# Patient Record
Sex: Male | Born: 1995 | Race: White | Hispanic: No | Marital: Single | Smoking: Never smoker
Health system: Southern US, Community
[De-identification: ages and names within clinical notes are randomized; demographics above are authoritative.]

## PROBLEM LIST (undated history)

## (undated) DIAGNOSIS — E860 Dehydration: Secondary | ICD-10-CM

## (undated) DIAGNOSIS — R55 Syncope and collapse: Secondary | ICD-10-CM

## (undated) HISTORY — DX: Dehydration: E86.0

## (undated) HISTORY — DX: Syncope and collapse: R55

---
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
## 2016-01-17 ENCOUNTER — Encounter: Payer: Self-pay | Admitting: Emergency Medicine

## 2016-01-17 ENCOUNTER — Other Ambulatory Visit: Payer: Self-pay

## 2016-01-17 ENCOUNTER — Emergency Department
Admission: EM | Admit: 2016-01-17 | Discharge: 2016-01-17 | Disposition: A | Discharge status: Home / Self Care | Payer: BLUE CROSS/BLUE SHIELD | Attending: Emergency Medicine | Admitting: Emergency Medicine

## 2016-01-17 DIAGNOSIS — R000 Tachycardia, unspecified: Secondary | ICD-10-CM | POA: Insufficient documentation

## 2016-01-17 DIAGNOSIS — R112 Nausea with vomiting, unspecified: Secondary | ICD-10-CM | POA: Diagnosis not present

## 2016-01-17 DIAGNOSIS — E860 Dehydration: Secondary | ICD-10-CM | POA: Diagnosis not present

## 2016-01-17 DIAGNOSIS — R42 Dizziness and giddiness: Secondary | ICD-10-CM | POA: Diagnosis present

## 2016-01-17 LAB — CBC WITH DIFFERENTIAL/PLATELET
Basophils Absolute: 0.1 10*3/uL (ref 0–0.1)
Basophils Relative: 0 %
Eosinophils Absolute: 0 10*3/uL (ref 0–0.7)
Eosinophils Relative: 0 %
HEMATOCRIT: 42.2 % (ref 40.0–52.0)
HEMOGLOBIN: 14.7 g/dL (ref 13.0–18.0)
Lymphocytes Relative: 11 %
Lymphs Abs: 2.1 10*3/uL (ref 1.0–3.6)
MCH: 28.5 pg (ref 26.0–34.0)
MCHC: 34.8 g/dL (ref 32.0–36.0)
MCV: 81.7 fL (ref 80.0–100.0)
MONO ABS: 0.8 10*3/uL (ref 0.2–1.0)
MONOS PCT: 4 %
NEUTROS ABS: 16.8 10*3/uL — AB (ref 1.4–6.5)
NEUTROS PCT: 85 %
Platelets: 298 10*3/uL (ref 150–440)
RBC: 5.17 MIL/uL (ref 4.40–5.90)
RDW: 12.7 % (ref 11.5–14.5)
WBC: 19.8 10*3/uL — ABNORMAL HIGH (ref 3.8–10.6)

## 2016-01-17 LAB — BASIC METABOLIC PANEL
ANION GAP: 10 (ref 5–15)
BUN: 19 mg/dL (ref 6–20)
CO2: 25 mmol/L (ref 22–32)
CREATININE: 1.34 mg/dL — AB (ref 0.61–1.24)
Calcium: 9.3 mg/dL (ref 8.9–10.3)
Chloride: 107 mmol/L (ref 101–111)
GFR calc Af Amer: >60 mL/min (ref >60)
GFR calc non Af Amer: >60 mL/min (ref >60)
GLUCOSE: 96 mg/dL (ref 65–99)
Potassium: 4.3 mmol/L (ref 3.5–5.1)
Sodium: 142 mmol/L (ref 135–145)

## 2016-01-17 MED ORDER — SODIUM CHLORIDE 0.9 % IV BOLUS (SEPSIS)
1000.0000 mL | Freq: Once | INTRAVENOUS | Status: AC
  Administered 2016-01-17: 1000 mL via INTRAVENOUS

## 2016-01-17 NOTE — ED Provider Notes (Signed)
Time Seen: Approximately 2020 I have reviewed the triage notes  Chief Complaint: Dizziness; Tachycardia; and Emesis   History of Present Illness: Vincent Jimenez is a 20 y.o. male who states that he was at crossfit today and was working out very aggressively. He states afterward he felt very lightheaded and had nausea and vomited 1 but no blood or bile. He states he still felt very lightheadedness heart was racing. He denies any chest pain or loss of consciousness. He states he is otherwise healthy and denies any illicit drug usage. He states he feels better after he had a chance to get some IV fluids.   History reviewed. No pertinent past medical history.  There are no active problems to display for this patient.   History reviewed. No pertinent past surgical history.  History reviewed. No pertinent past surgical history.  No current outpatient prescriptions on file.  Allergies:  Review of patient's allergies indicates no known allergies.  Family History: No family history on file.  Social History: Social History  Substance Use Topics  . Smoking status: Never Smoker   . Smokeless tobacco: Never Used  . Alcohol Use: No     Review of Systems:   10 point review of systems was performed and was otherwise negative:  Constitutional: No fever Eyes: No visual disturbances ENT: No sore throat, ear pain Cardiac: No chest pain Respiratory: No shortness of breath, wheezing, or stridor Abdomen: No abdominal pain, no vomiting, No diarrhea Endocrine: No weight loss, No night sweats Extremities: No peripheral edema, cyanosis Skin: No rashes, easy bruising Neurologic: No focal weakness, trouble with speech or swollowing Urologic: No dysuria, Hematuria, or urinary frequency *  Physical Exam:  ED Triage Vitals  Enc Vitals Group     BP 01/17/16 1950 143/85 mmHg     Pulse Rate 01/17/16 1950 119     Resp 01/17/16 1950 18     Temp 01/17/16 1950 98 F (36.7 C)     Temp  Source 01/17/16 1950 Oral     SpO2 01/17/16 1950 98 %     Weight --      Height --      Head Cir --      Peak Flow --      Pain Score --      Pain Loc --      Pain Edu? --      Excl. in GC? --     General: Awake , Alert , and Oriented times 3; GCS 15 Head: Normal cephalic , atraumatic Eyes: Pupils equal , round, reactive to light Nose/Throat: No nasal drainage, patent upper airway without erythema or exudate.  Neck: Supple, Full range of motion, No anterior adenopathy or palpable thyroid masses Lungs: Clear to ascultation without wheezes , rhonchi, or rales Heart: Tachycardic, regular rhythm without murmurs , gallops , or rubs Abdomen: Soft, non tender without rebound, guarding , or rigidity; bowel sounds positive and symmetric in all 4 quadrants. No organomegaly .        Extremities: 2 plus symmetric pulses. No edema, clubbing or cyanosis Neurologic: normal ambulation, Motor symmetric without deficits, sensory intact Skin: warm, dry, no rashes   Labs:   All laboratory work was reviewed including any pertinent negatives or positives listed below:  Labs Reviewed  BASIC METABOLIC PANEL  CBC WITH DIFFERENTIAL/PLATELET   review laboratory work shows an elevated white blood cell count which may be hemoconcentration but otherwise no significant abnormalities. Patent and also slightly elevated again  likely due to dehydration  EKG: ED ECG REPORT I, Jennye Moccasin, the attending physician, personally viewed and interpreted this ECG.  Date: 01/17/2016 EKG Time: 1954 Rate: 118 Rhythm: normal sinus rhythm QRS Axis: normal Intervals: normal ST/T Wave abnormalities: normal Conduction Disturbances: none Narrative Interpretation: unremarkable No acute abnormalities   ED Course:  Patient's stay here showed symptomatic improvement. He received 2 L of normal saline and had no other further episodes of nausea. Since he is able to maintain oral food and fluid intake I felt he could be  discharged at this point. He is been advised that he still needs to continue drinking plenty of fluids at home and return here if he has any other new issues.    Assessment: * Dehydration     Plan:  Outpatient management Patient was advised to return immediately if condition worsens. Patient was advised to follow up with their primary care physician or other specialized physicians involved in their outpatient care           Jennye Moccasin, MD 01/17/16 2256

## 2016-01-17 NOTE — ED Notes (Signed)
MD at the bedside to discuss discharge plan

## 2016-01-17 NOTE — Discharge Instructions (Signed)
Dehydration in Sports °Dehydration is a condition in which you do not have enough fluid or water in your body. Your body needs a certain amount of water and other fluid to maintain its blood volume. During exercise, your body may not be able to maintain the fluid levels that are needed to function properly. Dehydration happens when you take in less fluid than you lose. Athletes lose fluid during exercise when they sweat and breathe. Additional fluid is lost during urination, vomiting, and diarrhea. To prevent dehydration, it is important for athletes to take in enough water and fluid to replace the fluid that they lose during exercise. °CAUSES °Common causes of dehydration among athletes include: °· Diarrhea. °· Vomiting. °· Not drinking enough fluid during strenuous exercise or during an illness. °· Not eating enough food during strenuous exercise or during an illness. °· Not consuming enough fluid or food after strenuous exercise. °· Exercising in hot or humid weather. °RISK FACTORS °This condition is more likely to develop in: °· Athletes who are taking certain medicines that cause the body to lose excess fluid (diuretics). °· Athletes who have a chronic illness, such as diabetes. °· Young children. °· Older adults. °· Athletes who live at high altitudes. °· Endurance athletes. °SYMPTOMS  °Mild Dehydration °· Thirst. °· Dry lips. °· Slightly dry mouth. °· Dry, warm skin. °Moderate Dehydration °· Very dry mouth. °· Muscle cramps. °· Dark urine and decreased urine production. °· Decreased tear production. °· Headache. °· Light-headedness, especially when you stand up from a sitting position. °Severe Dehydration °· Changes in skin. °¨ Blue lips. °¨ Skin does not spring back quickly when lightly pinched and released. °· Changes in body fluids. °¨ Extreme thirst. °¨ No tears. °¨ Not able to sweat when body temperature is high, such as in hot weather. °¨ Minimal urine production. °· Changes in vital signs. °¨ Rapid,  weak pulse (more than 100 beats per minute when you are sitting still). °¨ Rapid breathing. °¨ Low blood pressure. °¨ Unconsciousness. °· Other changes. °¨ Sunken eyes. °¨ Cold hands and feet. °¨ Confusion and lethargy. °¨ Difficulty being awakened. °¨ Fainting (syncope). °¨ Short-term weight loss. °DIAGNOSIS °This condition may be diagnosed based on your symptoms. You may also have tests to determine how severe your dehydration is. These tests may include: °· Urine tests. °· Blood tests. °TREATMENT °Dehydration should be treated right away. Do not wait until dehydration becomes severe. Treatment depends on the severity of the dehydration. °Treatment for Mild Dehydration °· Drinking plenty of water to replace the fluid you have lost. °· Replacing minerals in your blood (electrolytes) that you may have lost. °Treatment for Moderate Dehydration °· Consuming oral rehydration solution (ORS). °Treatment for Severe Dehydration °· Receiving fluid through an IV tube. °· Receiving electrolyte solution through a feeding tube that is passed through your nose and into your stomach (nasogastric tube or NG tube). °HOME CARE INSTRUCTIONS °· Drink enough fluid to keep your urine clear or pale yellow. °· Drink water or fluid slowly by taking small sips. °· Have food or beverages that contain electrolytes. Examples include salt, bananas, and sports drinks. °· Take over-the-counter and prescription medicines only as told by your health care provider. °· Prepare ORS according to the manufacturer's instructions. Take sips of ORS every 5 minutes until your urine returns to normal. °· If you have vomiting or diarrhea, continue to try to drink water, ORS, or both. °· If you have diarrhea, avoid: °¨ Beverages that contain caffeine. °¨   Fruit juice.  Milk.  Carbonated soft drinks.  Do not take salt tablets. This can lead to the condition of having too much sodium in your body (hypernatremia). PREVENTION  Drink water before, during,  and after physical activity, even if you do not feel thirsty. Drink small amounts of water frequently throughout sporting events. Drink more water if you are exercising in hot or humid weather or in high altitudes.  If you are exercising for more than an hour, consider drinking a sports drink.  If you are experiencing vomiting or diarrhea, avoid exercise.  Before and after exercise, eat plenty of foods that have a high water content. These include fruits and vegetables.  Avoid alcohol before, during, and after strenuous exercise. SEEK MEDICAL CARE IF:  You cannot eat or drink without vomiting.  You have severe diarrhea with vomiting or a fever.  You have severe diarrhea without vomiting or a fever.  You have had moderate diarrhea during a period of more than 24 hours. SEEK IMMEDIATE MEDICAL CARE IF:  You have extreme thirst.  You have not urinated in 6-8 hours, or you have urinated only a small amount of very dark urine.  You have shriveled skin.  You are dizzy, confused, or both.   This information is not intended to replace advice given to you by your health care provider. Make sure you discuss any questions you have with your health care provider.   Document Released: 11/25/2005 Document Revised: 08/16/2015 Document Reviewed: 12/09/2014 Elsevier Interactive Patient Education [employer]  Please return immediately if condition worsens. Please contact her primary physician or the physician you were given for referral. If you have any specialist physicians involved in her treatment and plan please also contact them. Thank you for using [place] [facility].

## 2016-01-17 NOTE — ED Notes (Signed)
Pt up to restroom with steady gait. Denies dizziness at this time.

## 2021-03-16 ENCOUNTER — Emergency Department: Payer: BLUE CROSS/BLUE SHIELD

## 2021-03-16 ENCOUNTER — Other Ambulatory Visit: Payer: Self-pay

## 2021-03-16 ENCOUNTER — Emergency Department
Admission: EM | Admit: 2021-03-16 | Discharge: 2021-03-17 | Disposition: A | Discharge status: Home / Self Care | Payer: BLUE CROSS/BLUE SHIELD | Attending: Emergency Medicine | Admitting: Emergency Medicine

## 2021-03-16 DIAGNOSIS — R079 Chest pain, unspecified: Secondary | ICD-10-CM

## 2021-03-16 DIAGNOSIS — R0789 Other chest pain: Secondary | ICD-10-CM | POA: Diagnosis not present

## 2021-03-16 LAB — BASIC METABOLIC PANEL
Anion gap: 8 (ref 5–15)
BUN: 10 mg/dL (ref 6–20)
CO2: 24 mmol/L (ref 22–32)
Calcium: 9 mg/dL (ref 8.9–10.3)
Chloride: 105 mmol/L (ref 98–111)
Creatinine, Ser: 1.05 mg/dL (ref 0.61–1.24)
GFR, Estimated: >60 mL/min (ref >60)
Glucose, Bld: 93 mg/dL (ref 70–99)
Potassium: 3.8 mmol/L (ref 3.5–5.1)
Sodium: 137 mmol/L (ref 135–145)

## 2021-03-16 LAB — CBC
HCT: 44.5 % (ref 39.0–52.0)
Hemoglobin: 15.9 g/dL (ref 13.0–17.0)
MCH: 28.8 pg (ref 26.0–34.0)
MCHC: 35.7 g/dL (ref 30.0–36.0)
MCV: 80.5 fL (ref 80.0–100.0)
Platelets: 346 10*3/uL (ref 150–400)
RBC: 5.53 MIL/uL (ref 4.22–5.81)
RDW: 12.2 % (ref 11.5–15.5)
WBC: 13.5 10*3/uL — ABNORMAL HIGH (ref 4.0–10.5)
nRBC: 0 % (ref 0.0–0.2)

## 2021-03-16 LAB — TROPONIN I (HIGH SENSITIVITY): Troponin I (High Sensitivity): 3 ng/L (ref <18)

## 2021-03-16 NOTE — ED Provider Notes (Signed)
[facility] Emergency Department Provider Note   ____________________________________________   Event Date/Time   First MD Initiated Contact with Patient 03/16/21 2357     (approximate)  I have reviewed the triage vital signs and the nursing notes.   HISTORY  Chief Complaint Chest Pain    HPI Vincent Jimenez is a 25 y.o. male who presents to the ED from home with a chief complaint of chest pain.  Patient reports left and right-sided chest pain x3 to 4 weeks.  Describes pain as sore and throbbing, and exacerbated by movement.  Patient did recently take out boxing.  Denies direct strikes to his chest.  Denies associated diaphoresis, shortness of breath, nausea/vomiting or dizziness.  Denies fever, cough, abdominal pain.  Denies COVID-19 exposure.  Denies recent travel or hormone use.     Past medical history None  There are no problems to display for this patient.   History reviewed. No pertinent surgical history.  Prior to Admission medications   Not on File    Allergies Patient has no known allergies.  History reviewed. No pertinent family history.  Social History Social History   Tobacco Use  . Smoking status: Never Smoker  . Smokeless tobacco: Never Used  Substance Use Topics  . Alcohol use: Yes    Comment: occasional   . Drug use: No    Review of Systems  Constitutional: No fever/chills Eyes: No visual changes. ENT: No sore throat. Cardiovascular: Positive for chest pain. Respiratory: Denies shortness of breath. Gastrointestinal: No abdominal pain.  No nausea, no vomiting.  No diarrhea.  No constipation. Genitourinary: Negative for dysuria. Musculoskeletal: Negative for back pain. Skin: Negative for rash. Neurological: Negative for headaches, focal weakness or numbness.   ____________________________________________   PHYSICAL EXAM:  VITAL SIGNS: ED Triage Vitals  Enc Vitals Group     BP 03/16/21 2108 (!) 153/100      Pulse Rate 03/16/21 2108 (!) 110     Resp 03/16/21 2108 15     Temp 03/16/21 2108 97.8 F (36.6 C)     Temp Source 03/16/21 2108 Oral     SpO2 03/16/21 2108 97 %     Weight 03/16/21 2103 208 lb (94.3 kg)     Height 03/16/21 2103 5\' 3"  (1.6 m)     Head Circumference --      Peak Flow --      Pain Score 03/16/21 2102 7     Pain Loc --      Pain Edu? --      Excl. in GC? --     Constitutional: Alert and oriented. Well appearing and in no acute distress. Eyes: Conjunctivae are normal. PERRL. EOMI. Head: Atraumatic. Nose: No congestion/rhinnorhea. Mouth/Throat: Mucous membranes are moist.   Neck: No stridor.   Cardiovascular: Normal rate, regular rhythm. Grossly normal heart sounds.  Good peripheral circulation. Respiratory: Normal respiratory effort.  No retractions. Lungs CTAB. Left anterior chest wall tender to palpation and with movement of trunk. Gastrointestinal: Soft and nontender to light or deep palpation. No distention. No abdominal bruits. No CVA tenderness. Musculoskeletal: No lower extremity tenderness nor edema.  No joint effusions. Neurologic:  Normal speech and language. No gross focal neurologic deficits are appreciated. No gait instability. Skin:  Skin is warm, dry and intact. No rash noted. Psychiatric: Mood and affect are normal. Speech and behavior are normal.  ____________________________________________   LABS (all labs ordered are listed, but only abnormal results are displayed)  Labs Reviewed  CBC - Abnormal; Notable for the following components:      Result Value   WBC 13.5 (*)    All other components within normal limits  BASIC METABOLIC PANEL  CK  TROPONIN I (HIGH SENSITIVITY)   ____________________________________________  EKG  ED ECG REPORT I, Jachai Okazaki J, the attending physician, personally viewed and interpreted this ECG.   Date: 03/17/2021  EKG Time: 2105  Rate: 119  Rhythm: sinus tachycardia  Axis: Normal  Intervals:none  ST&T  Change: Nonspecific  ____________________________________________  RADIOLOGY I, Kentravious Lipford J, personally viewed and evaluated these images (plain radiographs) as part of my medical decision making, as well as reviewing the written report by the radiologist.  ED MD interpretation: No acute cardiopulmonary process  Official radiology report(s): DG Chest 2 View  Result Date: 03/16/2021 CLINICAL DATA:  Chest pain for several weeks. EXAM: CHEST - 2 VIEW COMPARISON:  10/07/2011 FINDINGS: The cardiac silhouette, mediastinal and hilar contours are normal. The lungs are clear. The bony thorax is intact. IMPRESSION: Normal chest x-ray. Electronically Signed   By: Rudie Meyer M.D.   On: 03/16/2021 21:27    ____________________________________________   PROCEDURES  Procedure(s) performed (including Critical Care):  Procedures   ____________________________________________   INITIAL IMPRESSION / ASSESSMENT AND PLAN / ED COURSE  As part of my medical decision making, I reviewed the following data within the electronic MEDICAL RECORD NUMBER Nursing notes reviewed and incorporated, Labs reviewed, EKG interpreted, Old chart reviewed, Radiograph reviewed and Notes from prior ED visits     25 year old otherwise healthy male who presents with a several week history of chest pain Differential diagnosis includes, but is not limited to, ACS, aortic dissection, pulmonary embolism, cardiac tamponade, pneumothorax, pneumonia, pericarditis, myocarditis, GI-related causes including esophagitis/gastritis, and musculoskeletal chest wall pain.    Laboratory results including troponin unremarkable.  Do not feel repeat troponin warranted as chest pain has been ongoing for several weeks.  Given patient's recent boxing, will check CK.  Tachycardia noted, patient notes he has baseline resting tachycardia.  No history of thyroid issues.  Will administer IV fluids, IV Toradol for musculoskeletal pain and  reassess.  Clinical Course as of 03/17/21 0257  Sat Mar 17, 2021  0119 Updated patient on CK result.  Heart rate improved.  He is feeling significantly better.  Strict return precautions given.  Patient verbalizes understanding agrees with plan of care. [JS]    Clinical Course User Index [JS] Irean Hong, MD     ____________________________________________   FINAL CLINICAL IMPRESSION(S) / ED DIAGNOSES  Final diagnoses:  Nonspecific chest pain  Chest wall pain     ED Discharge Orders    None      *Please note:  Vincent Jimenez was evaluated in Emergency Department on 03/17/2021 for the symptoms described in the history of present illness. He was evaluated in the context of the global COVID-19 pandemic, which necessitated consideration that the patient might be at risk for infection with the SARS-CoV-2 virus that causes COVID-19. Institutional protocols and algorithms that pertain to the evaluation of patients at risk for COVID-19 are in a state of rapid change based on information released by regulatory bodies including the CDC and federal and state organizations. These policies and algorithms were followed during the patient's care in the ED.  Some ED evaluations and interventions may be delayed as a result of limited staffing during and the pandemic.*   Note:  This document was prepared using Dragon voice recognition software and may include  unintentional dictation errors.   Irean Hong, MD 03/17/21 [phone]

## 2021-03-16 NOTE — ED Provider Notes (Incomplete)
[facility] Emergency Department Provider Note   ____________________________________________   Event Date/Time   First MD Initiated Contact with Patient 03/16/21 2357     (approximate)  I have reviewed the triage vital signs and the nursing notes.   HISTORY  Chief Complaint Chest Pain    HPI Vincent Jimenez is a 25 y.o. male ***        {**SYMPTOM/COMPLAINT  LOCATION (describe anatomically) DURATION (when did it start) TIMING (onset and pattern) SEVERITY (0-10, mild/moderate/severe) QUALITY (description of symptoms) CONTEXT (recent surgery, new meds, activity, etc.) MODIFYINGFACTORS (what makes it better/worse) ASSOCIATEDSYMPTOMS (pertinent positives and negatives)**} History reviewed. No pertinent past medical history.  There are no problems to display for this patient.   History reviewed. No pertinent surgical history.  Prior to Admission medications   Not on File    Allergies Patient has no known allergies.  History reviewed. No pertinent family history.  Social History Social History   Tobacco Use  . Smoking status: Never Smoker  . Smokeless tobacco: Never Used  Substance Use Topics  . Alcohol use: Yes    Comment: occasional   . Drug use: No    Review of Systems {** Revise as appropriate then delete this line - Documentation of 10 systems is required  **} Constitutional: No fever/chills Eyes: No visual changes. ENT: No sore throat. Cardiovascular: Denies chest pain. Respiratory: Denies shortness of breath. Gastrointestinal: No abdominal pain.  No nausea, no vomiting.  No diarrhea.  No constipation. Genitourinary: Negative for dysuria. Musculoskeletal: Negative for back pain. Skin: Negative for rash. Neurological: Negative for headaches, focal weakness or numbness. {**Psychiatric:  Endocrine:  Hematological/Lymphatic:  Allergic/Immunilogical: **}  ____________________________________________   PHYSICAL  EXAM:  VITAL SIGNS: ED Triage Vitals  Enc Vitals Group     BP 03/16/21 2108 (!) 153/100     Pulse Rate 03/16/21 2108 (!) 110     Resp 03/16/21 2108 15     Temp 03/16/21 2108 97.8 F (36.6 C)     Temp Source 03/16/21 2108 Oral     SpO2 03/16/21 2108 97 %     Weight 03/16/21 2103 208 lb (94.3 kg)     Height 03/16/21 2103 5\' 3"  (1.6 m)     Head Circumference --      Peak Flow --      Pain Score 03/16/21 2102 7     Pain Loc --      Pain Edu? --      Excl. in GC? --    {** Revise as appropriate then delete this line - 8 systems required **} Constitutional: Alert and oriented. Well appearing and in no acute distress. Eyes: Conjunctivae are normal. PERRL. EOMI. Head: Atraumatic. Nose: No congestion/rhinnorhea. Mouth/Throat: Mucous membranes are moist.  Oropharynx non-erythematous. Neck: No stridor.  {**No cervical spine tenderness to palpation.**} {**Hematological/Lymphatic/Immunilogical: No cervical lymphadenopathy. **}Cardiovascular: Normal rate, regular rhythm. Grossly normal heart sounds.  Good peripheral circulation. Respiratory: Normal respiratory effort.  No retractions. Lungs CTAB. Gastrointestinal: Soft and nontender. No distention. No abdominal bruits. No CVA tenderness. {**Genitourinary:  **}Musculoskeletal: No lower extremity tenderness nor edema.  No joint effusions. Neurologic:  Normal speech and language. No gross focal neurologic deficits are appreciated. No gait instability. Skin:  Skin is warm, dry and intact. No rash noted. Psychiatric: Mood and affect are normal. Speech and behavior are normal.  ____________________________________________   LABS (all labs ordered are listed, but only abnormal results are displayed)  Labs Reviewed  CBC - Abnormal;  Notable for the following components:      Result Value   WBC 13.5 (*)    All other components within normal limits  BASIC METABOLIC PANEL  TROPONIN I (HIGH SENSITIVITY)  TROPONIN I (HIGH SENSITIVITY)    ____________________________________________  EKG  *** ____________________________________________  RADIOLOGY I, SUNG,JADE J, personally viewed and evaluated these images (plain radiographs) as part of my medical decision making, as well as reviewing the written report by the radiologist.  ED MD interpretation:  ***  Official radiology report(s): DG Chest 2 View  Result Date: 03/16/2021 CLINICAL DATA:  Chest pain for several weeks. EXAM: CHEST - 2 VIEW COMPARISON:  10/07/2011 FINDINGS: The cardiac silhouette, mediastinal and hilar contours are normal. The lungs are clear. The bony thorax is intact. IMPRESSION: Normal chest x-ray. Electronically Signed   By: Rudie Meyer M.D.   On: 03/16/2021 21:27    ____________________________________________   PROCEDURES  Procedure(s) performed (including Critical Care):  Procedures   ____________________________________________   INITIAL IMPRESSION / ASSESSMENT AND PLAN / ED COURSE  As part of my medical decision making, I reviewed the following data within the electronic MEDICAL RECORD NUMBER {[id number]::"Notes from prior ED visits","[place] Controlled Substance Database"}        ***      ____________________________________________   FINAL CLINICAL IMPRESSION(S) / ED DIAGNOSES  Final diagnoses:  None     ED Discharge Orders    None      *Please note:  Vincent Jimenez was evaluated in Emergency Department on 03/16/2021 for the symptoms described in the history of present illness. He was evaluated in the context of the global COVID-19 pandemic, which necessitated consideration that the patient might be at risk for infection with the SARS-CoV-2 virus that causes COVID-19. Institutional protocols and algorithms that pertain to the evaluation of patients at risk for COVID-19 are in a state of rapid change based on information released by regulatory bodies including the CDC and federal and state organizations. These policies and  algorithms were followed during the patient's care in the ED.  Some ED evaluations and interventions may be delayed as a result of limited staffing during and the pandemic.*   Note:  This document was prepared using Dragon voice recognition software and may include unintentional dictation errors.

## 2021-03-16 NOTE — ED Triage Notes (Signed)
Pt presents to ER c/o chest pain that has been ongoing for several weeks.  Pt describes pain as throbbing and it is located in the left rib cage area and some on the right side of his chest.   Pt denies hx of cardiac problems. Pt states he did recently take up boxing, and thinks he might have a muscle strain.  Pt A&O x4 at this time in NAD.

## 2021-03-17 LAB — CK: Total CK: 200 U/L (ref 49–397)

## 2021-03-17 MED ORDER — SODIUM CHLORIDE 0.9 % IV BOLUS
1000.0000 mL | Freq: Once | INTRAVENOUS | Status: AC
  Administered 2021-03-17: 1000 mL via INTRAVENOUS

## 2021-03-17 MED ORDER — KETOROLAC TROMETHAMINE 30 MG/ML IJ SOLN
15.0000 mg | Freq: Once | INTRAMUSCULAR | Status: AC
  Administered 2021-03-17: 15 mg via INTRAVENOUS
  Filled 2021-03-17: qty 1

## 2021-03-17 NOTE — ED Notes (Signed)
Patient denies pain and is resting comfortably.  

## 2021-03-17 NOTE — Discharge Instructions (Addendum)
You may take Tylenol and/or Ibuprofen as needed for pain.  Apply moist heat to affected area several times daily.  Return to the ER for worsening symptoms, persistent vomiting, difficulty breathing or other concerns. 

## 2021-04-03 ENCOUNTER — Other Ambulatory Visit: Payer: Self-pay

## 2021-04-03 ENCOUNTER — Emergency Department: Payer: BLUE CROSS/BLUE SHIELD

## 2021-04-03 ENCOUNTER — Emergency Department
Admission: EM | Admit: 2021-04-03 | Discharge: 2021-04-03 | Disposition: A | Discharge status: Home / Self Care | Payer: BLUE CROSS/BLUE SHIELD | Attending: Emergency Medicine | Admitting: Emergency Medicine

## 2021-04-03 DIAGNOSIS — R002 Palpitations: Secondary | ICD-10-CM | POA: Insufficient documentation

## 2021-04-03 LAB — CBC
HCT: 44.8 % (ref 39.0–52.0)
Hemoglobin: 16 g/dL (ref 13.0–17.0)
MCH: 28.9 pg (ref 26.0–34.0)
MCHC: 35.7 g/dL (ref 30.0–36.0)
MCV: 80.9 fL (ref 80.0–100.0)
Platelets: 358 10*3/uL (ref 150–400)
RBC: 5.54 MIL/uL (ref 4.22–5.81)
RDW: 12.5 % (ref 11.5–15.5)
WBC: 13.2 10*3/uL — ABNORMAL HIGH (ref 4.0–10.5)
nRBC: 0 % (ref 0.0–0.2)

## 2021-04-03 LAB — BASIC METABOLIC PANEL
Anion gap: 8 (ref 5–15)
BUN: 13 mg/dL (ref 6–20)
CO2: 23 mmol/L (ref 22–32)
Calcium: 8.8 mg/dL — ABNORMAL LOW (ref 8.9–10.3)
Chloride: 101 mmol/L (ref 98–111)
Creatinine, Ser: 1.04 mg/dL (ref 0.61–1.24)
GFR, Estimated: >60 mL/min (ref >60)
Glucose, Bld: 108 mg/dL — ABNORMAL HIGH (ref 70–99)
Potassium: 3.6 mmol/L (ref 3.5–5.1)
Sodium: 132 mmol/L — ABNORMAL LOW (ref 135–145)

## 2021-04-03 LAB — TSH: TSH: 3.644 u[IU]/mL (ref 0.350–4.500)

## 2021-04-03 LAB — TROPONIN I (HIGH SENSITIVITY): Troponin I (High Sensitivity): <2 ng/L (ref <18)

## 2021-04-03 LAB — MAGNESIUM: Magnesium: 2.2 mg/dL (ref 1.7–2.4)

## 2021-04-03 MED ORDER — PROPRANOLOL HCL 20 MG PO TABS: 20.0000 mg | ORAL_TABLET | Freq: Two times a day (BID) | ORAL | 0 refills | Status: DC

## 2021-04-03 MED ORDER — LACTATED RINGERS IV BOLUS
1000.0000 mL | Freq: Once | INTRAVENOUS | Status: AC
  Administered 2021-04-03: 1000 mL via INTRAVENOUS

## 2021-04-03 MED ORDER — PROPRANOLOL HCL 20 MG PO TABS
20.0000 mg | ORAL_TABLET | Freq: Once | ORAL | Status: AC
  Administered 2021-04-03: 20 mg via ORAL
  Filled 2021-04-03: qty 1

## 2021-04-03 NOTE — ED Notes (Signed)
Patient transported to X-ray 

## 2021-04-03 NOTE — ED Provider Notes (Signed)
[facility] Emergency Department Provider Note  ____________________________________________   Event Date/Time   First MD Initiated Contact with Patient 04/03/21 1507     (approximate)  I have reviewed the triage vital signs and the nursing notes.   HISTORY  Chief Complaint Palpitations    HPI Vincent Jimenez is a 25 y.o. male  Here with palpitations. Pt reports that he was at work today when around 1:30 PM, he developed acute onset of palpitations and chest pain. Began acutely, radiated toward his R arm. Prescribed propranolol by his PCP yesterday - says it made him feel better. No CP, SOB now after taking it. He had some mild SOB before taking the medication. These episodes have been coming and going for months. Tried omeprazole w/o significant relief of CP, palpitations. No h/o DVT/PE. No recent injuries. Unknown if he has checked a TSH. No known thyroid issue.       History reviewed. No pertinent past medical history.  There are no problems to display for this patient.   History reviewed. No pertinent surgical history.  Prior to Admission medications   Medication Sig Start Date End Date Taking? Authorizing Provider  propranolol (INDERAL) 20 MG tablet Take 1 tablet (20 mg total) by mouth 2 (two) times daily. 04/03/21 05/03/21 Yes Shaune Pollack, MD  omeprazole (PRILOSEC) 20 MG capsule PLEASE SEE ATTACHED FOR DETAILED DIRECTIONS 03/12/21   [provider]    Allergies Patient has no known allergies.  No family history on file.  Social History Social History   Tobacco Use  . Smoking status: Never Smoker  . Smokeless tobacco: Never Used  Substance Use Topics  . Alcohol use: Yes    Comment: occasional   . Drug use: No    Review of Systems  Review of Systems  Constitutional: Positive for fatigue. Negative for chills and fever.  HENT: Negative for sore throat.   Respiratory: Positive for chest tightness. Negative for shortness  of breath.   Cardiovascular: Positive for chest pain and palpitations.  Gastrointestinal: Negative for abdominal pain.  Genitourinary: Negative for flank pain.  Musculoskeletal: Negative for neck pain.  Skin: Negative for rash and wound.  Allergic/Immunologic: Negative for immunocompromised state.  Neurological: Negative for weakness and numbness.  Hematological: Does not bruise/bleed easily.  All other systems reviewed and are negative.    ____________________________________________  PHYSICAL EXAM:      VITAL SIGNS: ED Triage Vitals  Enc Vitals Group     BP 04/03/21 1510 (!) 143/74     Pulse Rate 04/03/21 1510 (!) 111     Resp 04/03/21 1510 17     Temp 04/03/21 1510 98.4 F (36.9 C)     Temp Source 04/03/21 1510 Oral     SpO2 04/03/21 1510 94 %     Weight 04/03/21 1511 207 lb 3.7 oz (94 kg)     Height 04/03/21 1511 5\' 3"  (1.6 m)     Head Circumference --      Peak Flow --      Pain Score 04/03/21 1511 0     Pain Loc --      Pain Edu? --      Excl. in GC? --      Physical Exam Vitals and nursing note reviewed.  Constitutional:      General: He is not in acute distress.    Appearance: He is well-developed.  HENT:     Head: Normocephalic and atraumatic.  Eyes:  Conjunctiva/sclera: Conjunctivae normal.  Cardiovascular:     Rate and Rhythm: Normal rate and regular rhythm.     Heart sounds: Normal heart sounds. No murmur heard. No friction rub.  Pulmonary:     Effort: Pulmonary effort is normal. No respiratory distress.     Breath sounds: Normal breath sounds. No wheezing or rales.  Abdominal:     General: There is no distension.     Palpations: Abdomen is soft.     Tenderness: There is no abdominal tenderness.  Musculoskeletal:     Cervical back: Neck supple.  Skin:    General: Skin is warm.     Capillary Refill: Capillary refill takes less than 2 seconds.  Neurological:     Mental Status: He is alert and oriented to person, place, and time.     Motor:  No abnormal muscle tone.       ____________________________________________   LABS (all labs ordered are listed, but only abnormal results are displayed)  Labs Reviewed  BASIC METABOLIC PANEL - Abnormal; Notable for the following components:      Result Value   Sodium 132 (*)    Glucose, Bld 108 (*)    Calcium 8.8 (*)    All other components within normal limits  CBC - Abnormal; Notable for the following components:   WBC 13.2 (*)    All other components within normal limits  MAGNESIUM  TSH  TROPONIN I (HIGH SENSITIVITY)  TROPONIN I (HIGH SENSITIVITY)    ____________________________________________  EKG: Sinus tachycardia, VR 118. PR 113, QRS 75, QTc 513. No acute St elevations or depressions. No ischemia or infarct. ________________________________________  RADIOLOGY All imaging, including plain films, CT scans, and ultrasounds, independently reviewed by me, and interpretations confirmed via formal radiology reads.  ED MD interpretation:   CXR: Clear  Official radiology report(s): DG Chest 2 View  Result Date: 04/03/2021 CLINICAL DATA:  25 year old male with palpitation. EXAM: CHEST - 2 VIEW COMPARISON:  Chest radiograph dated 03/16/2021. FINDINGS: The heart size and mediastinal contours are within normal limits. Both lungs are clear. The visualized skeletal structures are unremarkable. IMPRESSION: No active cardiopulmonary disease. Electronically Signed   By: Elgie Collard M.D.   On: 04/03/2021 16:01    ____________________________________________  PROCEDURES   Procedure(s) performed (including Critical Care):  Procedures  ____________________________________________  INITIAL IMPRESSION / MDM / ASSESSMENT AND PLAN / ED COURSE  As part of my medical decision making, I reviewed the following data within the electronic MEDICAL RECORD NUMBER Nursing notes reviewed and incorporated, Old chart reviewed, Notes from prior ED visits, and [place] Controlled Substance  Database       *Vincent Jimenez was evaluated in Emergency Department on 04/03/2021 for the symptoms described in the history of present illness. He was evaluated in the context of the global COVID-19 pandemic, which necessitated consideration that the patient might be at risk for infection with the SARS-CoV-2 virus that causes COVID-19. Institutional protocols and algorithms that pertain to the evaluation of patients at risk for COVID-19 are in a state of rapid change based on information released by regulatory bodies including the CDC and federal and state organizations. These policies and algorithms were followed during the patient's care in the ED.  Some ED evaluations and interventions may be delayed as a result of limited staffing during the pandemic.*     Medical Decision Making:  25 yo M here palpitations, transient CP/SOB. Suspect transient arrhythmia, likely SVT vs ST, possibly with component of anxiety though  sx start as palpitations/CP. EKG here is non ischemic, he has a slightly prolonged QT but has had normal QTs previously, no syncope, no signs of significant long QT syndrome or other malignant arrhythmia. Labs reassuring. CBC without leukocytosis or anemia. Lytes wnl. Trop neg. Pt just had a CT Angio last week for the same sx which was negative, and has no leg swelling or signs of DVT or PE.  Had a long discussion with the patient and his mother.  Will have him start to schedule his propranolol, as this did significantly improve and resolve his symptoms today.  He will start taking this twice a day with an additional dose as needed.  Will refer him to cardiology for further work-up and possible echocardiogram/monitoring.  Patient updated and in agreement.  Advised against any heavy exercise until cleared by cardiology.  Return precautions given.  ____________________________________________  FINAL CLINICAL IMPRESSION(S) / ED DIAGNOSES  Final diagnoses:  Palpitations      MEDICATIONS GIVEN DURING THIS VISIT:  Medications  lactated ringers bolus 1,000 mL (0 mLs Intravenous Stopped 04/03/21 1705)  propranolol (INDERAL) tablet 20 mg (20 mg Oral Given 04/03/21 1639)     ED Discharge Orders         Ordered    propranolol (INDERAL) 20 MG tablet  2 times daily        04/03/21 1637           Note:  This document was prepared using Dragon voice recognition software and may include unintentional dictation errors.   Shaune Pollack, MD 04/03/21 [phone]

## 2021-04-03 NOTE — Discharge Instructions (Addendum)
START taking the Propranolol as follows:  Start taking Propranolol 20 mg twice daily, in the mornings and afternoons, regardless of symptoms for the next 1-2 weeks.  You can take UP TO 60 mg of propranolol daily. If you begin to notice that you are having extra/severe palpitations, you can take one additional dose of propranolol per day.  Try to keep track of your blood pressure and heart rate at home.  Call Dr. Serita Kyle office [facility]). I'd recommend being seen in the next 1-2 weeks for further work-up and monitoring.  Avoid caffeine, tobacco, alcohol  Try to get at least 8 hr of sleep daily

## 2021-04-03 NOTE — ED Triage Notes (Signed)
Pt to ER via [facility] from work with complaints of feeling like his heart is racing and palpitations that started around 1330 this afternoon. Pt reports centralized on radiating, throbbing chest pain that has improved on route to ER.   Pt reports seeing cardiologist recently and being prescribed propanol.   EMS Hr 130 on ems arrival, decreased to 112.

## 2021-04-03 NOTE — ED Notes (Signed)
Provided dc instructions. Verbalized understanding.

## 2021-04-06 ENCOUNTER — Encounter: Payer: Self-pay | Admitting: Cardiology

## 2021-04-06 ENCOUNTER — Other Ambulatory Visit: Payer: Self-pay

## 2021-04-06 ENCOUNTER — Ambulatory Visit (INDEPENDENT_AMBULATORY_CARE_PROVIDER_SITE_OTHER): Payer: BLUE CROSS/BLUE SHIELD | Admitting: Cardiology

## 2021-04-06 VITALS — BP 110/80 | HR 95 | Ht 63.0 in | Wt 203.0 lb

## 2021-04-06 DIAGNOSIS — R079 Chest pain, unspecified: Secondary | ICD-10-CM

## 2021-04-06 DIAGNOSIS — R002 Palpitations: Secondary | ICD-10-CM | POA: Diagnosis not present

## 2021-04-06 DIAGNOSIS — K2100 Gastro-esophageal reflux disease with esophagitis, without bleeding: Secondary | ICD-10-CM | POA: Diagnosis not present

## 2021-04-06 NOTE — Progress Notes (Signed)
Cardiology Office Note:    Date:  04/06/2021   ID:  Vincent Jimenez, DOB 08/28/1996, MRN [id number]  PCP:  [facility]  Cardiologist:  Debbe Odea, MD  Advanced Practice Provider:  No care team member to display Electrophysiologist:  None       Referring MD: Shaune Pollack, MD   Chief Complaint  Patient presents with  . New Patient (Initial Visit)    ED follow up- Patient c.o fast HR and SOB. Meds reviewed verbally with patient.    Vincent Jimenez is a 25 y.o. male who is being seen today for the evaluation of palpitations at the request of Shaune Pollack, MD.  History of Present Illness:    Vincent Jimenez is a 25 y.o. male with a hx of GERD who presents due to chest pain and palpitations.  Patient states having chest discomfort over the past 2 months.  Symptoms typically occur when he stretches his arm.  He states exercising via boxing, push-ups/core exercises 3 months ago prior to symptoms starting.  Also endorses reflux especially when he eats spicy foods.  Symptoms of rapid heart rate/palpitations present after he develops chest pain.  Pushing around his breastbone and right ribs causes tenderness.  Denies smoking cigarettes, smokes marijuana.  Denies any history of heart disease.  History reviewed. No pertinent past medical history.  History reviewed. No pertinent surgical history.  Current Medications: Current Meds  Medication Sig  . omeprazole (PRILOSEC) 20 MG capsule PLEASE SEE ATTACHED FOR DETAILED DIRECTIONS  . propranolol (INDERAL) 20 MG tablet Take 1 tablet (20 mg total) by mouth 2 (two) times daily.     Allergies:   Patient has no known allergies.   Social History   Socioeconomic History  . Marital status: Single    Spouse name: Not on file  . Number of children: Not on file  . Years of education: Not on file  . Highest education level: Not on file  Occupational History  . Not on file  Tobacco  Use  . Smoking status: Never Smoker  . Smokeless tobacco: Never Used  Substance and Sexual Activity  . Alcohol use: Yes    Comment: occasional   . Drug use: No  . Sexual activity: Not on file  Other Topics Concern  . Not on file  Social History Narrative  . Not on file   Social Determinants of Health   Financial Resource Strain: Not on file  Food Insecurity: Not on file  Transportation Needs: Not on file  Physical Activity: Not on file  Stress: Not on file  Social Connections: Not on file     Family History: The patient's family history is not on file.  ROS:   Please see the history of present illness.     All other systems reviewed and are negative.  EKGs/Labs/Other Studies Reviewed:    The following studies were reviewed today:   EKG:  EKG is  ordered today.  The ekg ordered today demonstrates sinus rhythm  Recent Labs: 04/03/2021: BUN 13; Creatinine, Ser 1.04; Hemoglobin 16.0; Magnesium 2.2; Platelets 358; Potassium 3.6; Sodium 132; TSH 3.644  Recent Lipid Panel No results found for: CHOL, TRIG, HDL, CHOLHDL, VLDL, LDLCALC, LDLDIRECT   Risk Assessment/Calculations:     Physical Exam:    VS:  BP 110/80 (BP Location: Left Arm, Patient Position: Sitting, Cuff Size: Normal)   Pulse 95   Ht 5\' 3"  (1.6 m)   Wt 203  lb (92.1 kg)   SpO2 97%   BMI 35.96 kg/m     Wt Readings from Last 3 Encounters:  04/06/21 203 lb (92.1 kg)  04/03/21 207 lb 3.7 oz (94 kg)  03/16/21 208 lb (94.3 kg)     GEN:  Well nourished, well developed in no acute distress HEENT: Normal NECK: No JVD; No carotid bruits LYMPHATICS: No lymphadenopathy CARDIAC: RRR, no murmurs, rubs, gallops RESPIRATORY:  Clear to auscultation without rales, wheezing or rhonchi  ABDOMEN: Soft, non-tender, non-distended MUSCULOSKELETAL:  No edema; midsternal tenderness, right parasternal tenderness noted. SKIN: Warm and dry NEUROLOGIC:  Alert and oriented x 3 PSYCHIATRIC:  Normal affect   ASSESSMENT:     1. Chest pain of uncertain etiology   2. Palpitations   3. Gastroesophageal reflux disease with esophagitis without hemorrhage    PLAN:    In order of problems listed above:  1. Chest pain, noncardiac/musculoskeletal etiology with tenderness on sternal palpation.  Trial of NSAIDs advised. 2. Palpitations occurring from chest discomfort, low cardiac risk patient, likely sinus tachycardia.  Monitor symptoms, if persists, may consider cardiac monitor. 3. Reflux, on omeprazole.  Continue omeprazole, decrease intake of spicy foods.  Follow-up in 6 months     Medication Adjustments/Labs and Tests Ordered: Current medicines are reviewed at length with the patient today.  Concerns regarding medicines are outlined above.  Orders Placed This Encounter  Procedures  . EKG 12-Lead   No orders of the defined types were placed in this encounter.   Patient Instructions  Medication Instructions:  Your physician recommends that you continue on your current medications as directed. Please refer to the Current Medication list given to you today.  *If you need a refill on your cardiac medications before your next appointment, please call your pharmacy*   Lab Work: None ordered If you have labs (blood work) drawn today and your tests are completely normal, you will receive your results only by: Marland Kitchen MyChart Message (if you have MyChart) OR . A paper copy in the mail If you have any lab test that is abnormal or we need to change your treatment, we will call you to review the results.   Testing/Procedures: None ordered   Follow-Up: At [facility], you and your health needs are our priority.  As part of our continuing mission to provide you with exceptional heart care, we have created designated Provider Care Teams.  These Care Teams include your primary Cardiologist (physician) and Advanced Practice Providers (APPs -  Physician Assistants and Nurse Practitioners) who all work together to  provide you with the care you need, when you need it.  We recommend signing up for the patient portal called "MyChart".  Sign up information is provided on this After Visit Summary.  MyChart is used to connect with patients for Virtual Visits (Telemedicine).  Patients are able to view lab/test results, encounter notes, upcoming appointments, etc.  Non-urgent messages can be sent to your provider as well.   To learn more about what you can do with MyChart, go to [email or web address].    Your next appointment:   6 month(s)  The format for your next appointment:   In Person  Provider:   Debbe Odea, MD   Other Instructions      Signed, Debbe Odea, MD  04/06/2021 12:29 PM    [facility]

## 2021-04-06 NOTE — Patient Instructions (Signed)

## 2021-05-29 DIAGNOSIS — F419 Anxiety disorder, unspecified: Secondary | ICD-10-CM | POA: Insufficient documentation

## 2022-03-08 ENCOUNTER — Encounter: Payer: Self-pay | Admitting: Internal Medicine

## 2022-03-08 ENCOUNTER — Ambulatory Visit (INDEPENDENT_AMBULATORY_CARE_PROVIDER_SITE_OTHER): Payer: BC Managed Care – PPO | Admitting: Internal Medicine

## 2022-03-08 VITALS — BP 124/82 | HR 117 | Temp 98.1°F | Resp 16 | Ht 63.75 in | Wt 218.5 lb

## 2022-03-08 DIAGNOSIS — Z114 Encounter for screening for human immunodeficiency virus [HIV]: Secondary | ICD-10-CM

## 2022-03-08 DIAGNOSIS — R5383 Other fatigue: Secondary | ICD-10-CM | POA: Diagnosis not present

## 2022-03-08 DIAGNOSIS — R079 Chest pain, unspecified: Secondary | ICD-10-CM

## 2022-03-08 DIAGNOSIS — Z1159 Encounter for screening for other viral diseases: Secondary | ICD-10-CM

## 2022-03-08 DIAGNOSIS — R0683 Snoring: Secondary | ICD-10-CM | POA: Diagnosis not present

## 2022-03-08 MED ORDER — NAPROXEN 500 MG PO TABS: 500.0000 mg | ORAL_TABLET | Freq: Two times a day (BID) | ORAL | 0 refills | Status: AC

## 2022-03-08 MED ORDER — TIZANIDINE HCL 4 MG PO TABS: 4.0000 mg | ORAL_TABLET | Freq: Four times a day (QID) | ORAL | 0 refills | Status: AC | PRN

## 2022-03-08 NOTE — Progress Notes (Signed)
? ?New Patient Office Visit ? ?Subjective:  ?Patient ID: Vincent Jimenez, male    DOB: 07/13/1996  Age: 26 y.o. MRN: [id number] ? ?CC:  ?Chief Complaint  ?Patient presents with  ? Establish Care  ? Chest Pain  ?  Onset X2 years seen cardio last year and was told muscular pain.  Still there and unchanged  ? ? ?HPI ?[name] presents as a new patient. He has not been diagnosed with any chronic medical conditions and takes no daily medications.  ? ?CHEST PAIN ?Time since onset: Duration: 2 years  ?Onset: gradual ?Quality: dull, aching, and throbbing ?Severity: 5/10 ?Location: left para substernal ?Radiation: none ?Episode duration:  ?Frequency: constant ?Related to exertion: no ?Activity when pain started:  ?Trauma: no ?Sometimes worse with eating, but not anything in particular  ?Anxiety/recent stressors: no ?Aggravating factors:  ?Alleviating factors:  ?Status: stable ?Treatments attempted: antacids  ?Current pain status: pain free ?Shortness of breath: yes ?Cough: no ?Nausea: no ?Diaphoresis: yes ?Heartburn:  sometimes but feels separation  ?Palpitations: yes ?Left sided chest wall pain, associated with turning neck. Had been doing boxing at the time he first had the pain but no longer boxing, constant for 2 years. Better with stretching pecs. Did go to chirpractor which helped but pain would come back.  ?Had been on Propanolol at one point but stopped taking this after one month because nothing changed with it and he had been on Prilosec, which he does take occasionally for acid reflux but that pain feels differently than what he is currently experiencing.  ? 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 ?Health Maintenance: ?-Blood work due ?-Believes he is up to date with vaccines, will  check records for Tdap and HPV vaccines.  ? ?Past Medical History:  ?Diagnosis Date  ? Dehydration   ? Vasovagal episode   ? ? ?History reviewed. No pertinent surgical history. ? ?Family History  ?Problem Relation Age of Onset  ? Hyperlipidemia Father   ? Hypertension Father   ? ? ?Social History  ? ?Socioeconomic History  ? Marital status: Single  ?  Spouse name: Not on file  ? Number of children: Not on file  ? Years of education: Not on file  ? Highest education level: Not on file  ?Occupational History  ? Not on file  ?Tobacco Use  ? Smoking status: Never  ? Smokeless tobacco: Never  ?Vaping Use  ? Vaping Use: Never used  ?Substance and Sexual Activity  ? Alcohol use: Not Currently  ? Drug use: Not Currently  ? Sexual activity: Not Currently  ?Other Topics Concern  ? Not on file  ?Social History Narrative  ? Not on file  ? ?Social Determinants of Health  ? ?Financial Resource Strain: Not on file  ?Food Insecurity: Not on file  ?Transportation Needs: Not on file  ?Physical Activity: Not on file  ?Stress: Not on file  ?Social Connections: Not on file  ?Intimate Partner Violence: Not on file  ? ? ?ROS ?Review of Systems  ?Constitutional:  Positive for fatigue. Negative for chills and fever.  ?Eyes:  Negative for visual disturbance.  ?Respiratory:  Negative for cough and shortness of breath.   ?Cardiovascular:  Positive for chest pain and palpitations. Negative for leg swelling.  ?Gastrointestinal:  Negative  for abdominal pain, nausea and vomiting.  ?Neurological:  Negative for dizziness and headaches.  ? ?Objective:  ? ?Today's Vitals: BP 124/82   Pulse (!) 117   Temp 98.1 ?F (36.7 ?C)   Resp 16   Ht 5' 3.75" (1.619 m)   Wt 218 lb 8 oz (99.1 kg)   SpO2 98%   BMI 37.80 kg/m?  ? ?Physical Exam ?Constitutional:   ?   Appearance: He is well-developed.  ?HENT:  ?   Head: Normocephalic and atraumatic.  ?Eyes:  ?   Conjunctiva/sclera: Conjunctivae normal.  ?Neck:  ?   Vascular: No carotid bruit.  ?Cardiovascular:   ?   Rate and Rhythm: Regular rhythm. Tachycardia present.  ?   Pulses: Normal pulses.  ?   Heart sounds: Normal heart sounds.  ?Pulmonary:  ?   Effort: Pulmonary effort is normal.  ?   Breath sounds: Normal breath sounds.  ?Abdominal:  ?   General: There is no distension.  ?   Palpations: Abdomen is soft.  ?   Tenderness: There is no abdominal tenderness.  ?Musculoskeletal:     ?   General: Tenderness present.  ?   Right lower leg: No edema.  ?   Left lower leg: No edema.  ?   Comments: Left pectoral tenderness, no inhalation/exhalation rib somatic dysfunction on exam  ?Skin: ?   General: Skin is warm and dry.  ?Neurological:  ?   General: No focal deficit present.  ?   Mental Status: He is alert. Mental status is at baseline.  ?Psychiatric:     ?   Mood and Affect: Mood normal.     ?   Behavior: Behavior normal.  ? ? ?Assessment & Plan:  ? ?1. Chest pain, unspecified type: EKG with sinus tachycardia (rate 102), no St changes, no changes from EKG 4/22. Will obtain screening blood work but pain is consistent with MSK origin. A rib might be slipping but no somatic dysfunction today on exam. Will treat with scheduled anti-inflammatories and muscle relaxer and gentle stretching. Follow up in 1 month for recheck.  ? ?- CBC w/Diff/Platelet ?- COMPLETE METABOLIC PANEL WITH GFR ?- TSH ?- naproxen (NAPROSYN) 500 MG tablet; Take 1 tablet (500 mg total) by mouth 2 (two) times daily with a meal for 14 days.  Dispense: 28 tablet; Refill: 0 ?- tiZANidine (ZANAFLEX) 4 MG tablet; Take 1 tablet (4 mg total) by mouth every 6 (six) hours as needed for muscle spasms.  Dispense: 30 tablet; Refill: 0 ?- EKG 12-Lead ? ?2. Other fatigue/Snoring: Concerns about possible OSA, referral placed for sleep study.  ? ?- TSH ?- Ambulatory referral to Pulmonology ? ?3. Encounter for hepatitis C screening test for low risk patient/Screening for HIV without presence of risk factors: Screening due.  ? ?- Hepatitis C Antibody ?- HIV antibody (with  reflex) ? ? ?Follow-up: Return in about 4 weeks (around 04/05/2022) for follow up on chest pain.  ? ?Margarita Mail, DO ? ?

## 2022-03-08 NOTE — Patient Instructions (Addendum)
It was great seeing you today! ? ?Plan discussed at today's visit: ?-Blood work ordered today, results will be uploaded to MyChart.  ?-Recommend taking Naproxen 500 mg to take twice a day for 14 days - do NOT any other anti-inflammatories at the same time and take it with food  ?-Muscle relaxer sent to pharmacy as well, take at night until you know how it affects you ?-Also recommend Voltaren gel, over the counter anti-inflammatory gel ? ?Follow up in: 1 month  ? ?Take care and let us know if you have any questions or concerns prior to your next visit. ? ?Dr. Caralee Ates ? ?

## 2022-03-11 LAB — CBC WITH DIFFERENTIAL/PLATELET
Absolute Monocytes: 853 cells/uL (ref 200–950)
Basophils Absolute: 65 cells/uL (ref 0–200)
Basophils Relative: 0.6 %
Eosinophils Absolute: 119 cells/uL (ref 15–500)
Eosinophils Relative: 1.1 %
HCT: 46.6 % (ref 38.5–50.0)
Hemoglobin: 15.9 g/dL (ref 13.2–17.1)
Lymphs Abs: 3089 cells/uL (ref 850–3900)
MCH: 28.4 pg (ref 27.0–33.0)
MCHC: 34.1 g/dL (ref 32.0–36.0)
MCV: 83.4 fL (ref 80.0–100.0)
MPV: 11.3 fL (ref 7.5–12.5)
Monocytes Relative: 7.9 %
Neutro Abs: 6674 cells/uL (ref 1500–7800)
Neutrophils Relative %: 61.8 %
Platelets: 332 10*3/uL (ref 140–400)
RBC: 5.59 10*6/uL (ref 4.20–5.80)
RDW: 12.6 % (ref 11.0–15.0)
Total Lymphocyte: 28.6 %
WBC: 10.8 10*3/uL (ref 3.8–10.8)

## 2022-03-11 LAB — HEPATITIS C ANTIBODY
Hepatitis C Ab: NONREACTIVE
SIGNAL TO CUT-OFF: 0.06 (ref <1.00)

## 2022-03-11 LAB — COMPLETE METABOLIC PANEL WITH GFR
AG Ratio: 1.7 (calc) (ref 1.0–2.5)
ALT: 65 U/L — ABNORMAL HIGH (ref 9–46)
AST: 30 U/L (ref 10–40)
Albumin: 4.6 g/dL (ref 3.6–5.1)
Alkaline phosphatase (APISO): 57 U/L (ref 36–130)
BUN: 12 mg/dL (ref 7–25)
CO2: 24 mmol/L (ref 20–32)
Calcium: 9.8 mg/dL (ref 8.6–10.3)
Chloride: 102 mmol/L (ref 98–110)
Creat: 1.06 mg/dL (ref 0.60–1.24)
Globulin: 2.7 g/dL (calc) (ref 1.9–3.7)
Glucose, Bld: 75 mg/dL (ref 65–99)
Potassium: 3.8 mmol/L (ref 3.5–5.3)
Sodium: 137 mmol/L (ref 135–146)
Total Bilirubin: 0.5 mg/dL (ref 0.2–1.2)
Total Protein: 7.3 g/dL (ref 6.1–8.1)
eGFR: 99 mL/min/{1.73_m2} (ref >=60)

## 2022-03-11 LAB — TSH: TSH: 3.23 mIU/L (ref 0.40–4.50)

## 2022-03-11 LAB — HIV ANTIBODY (ROUTINE TESTING W REFLEX): HIV 1&2 Ab, 4th Generation: NONREACTIVE

## 2022-04-05 ENCOUNTER — Ambulatory Visit: Payer: BC Managed Care – PPO | Admitting: Internal Medicine

## 2022-12-16 IMAGING — CR DG CHEST 2V
2 series · 2 of 2 positions shown · non-contrast
Comparison: 10/07/2011

CLINICAL DATA: Chest pain for several weeks.

EXAM:
CHEST - 2 VIEW

[chest pa]
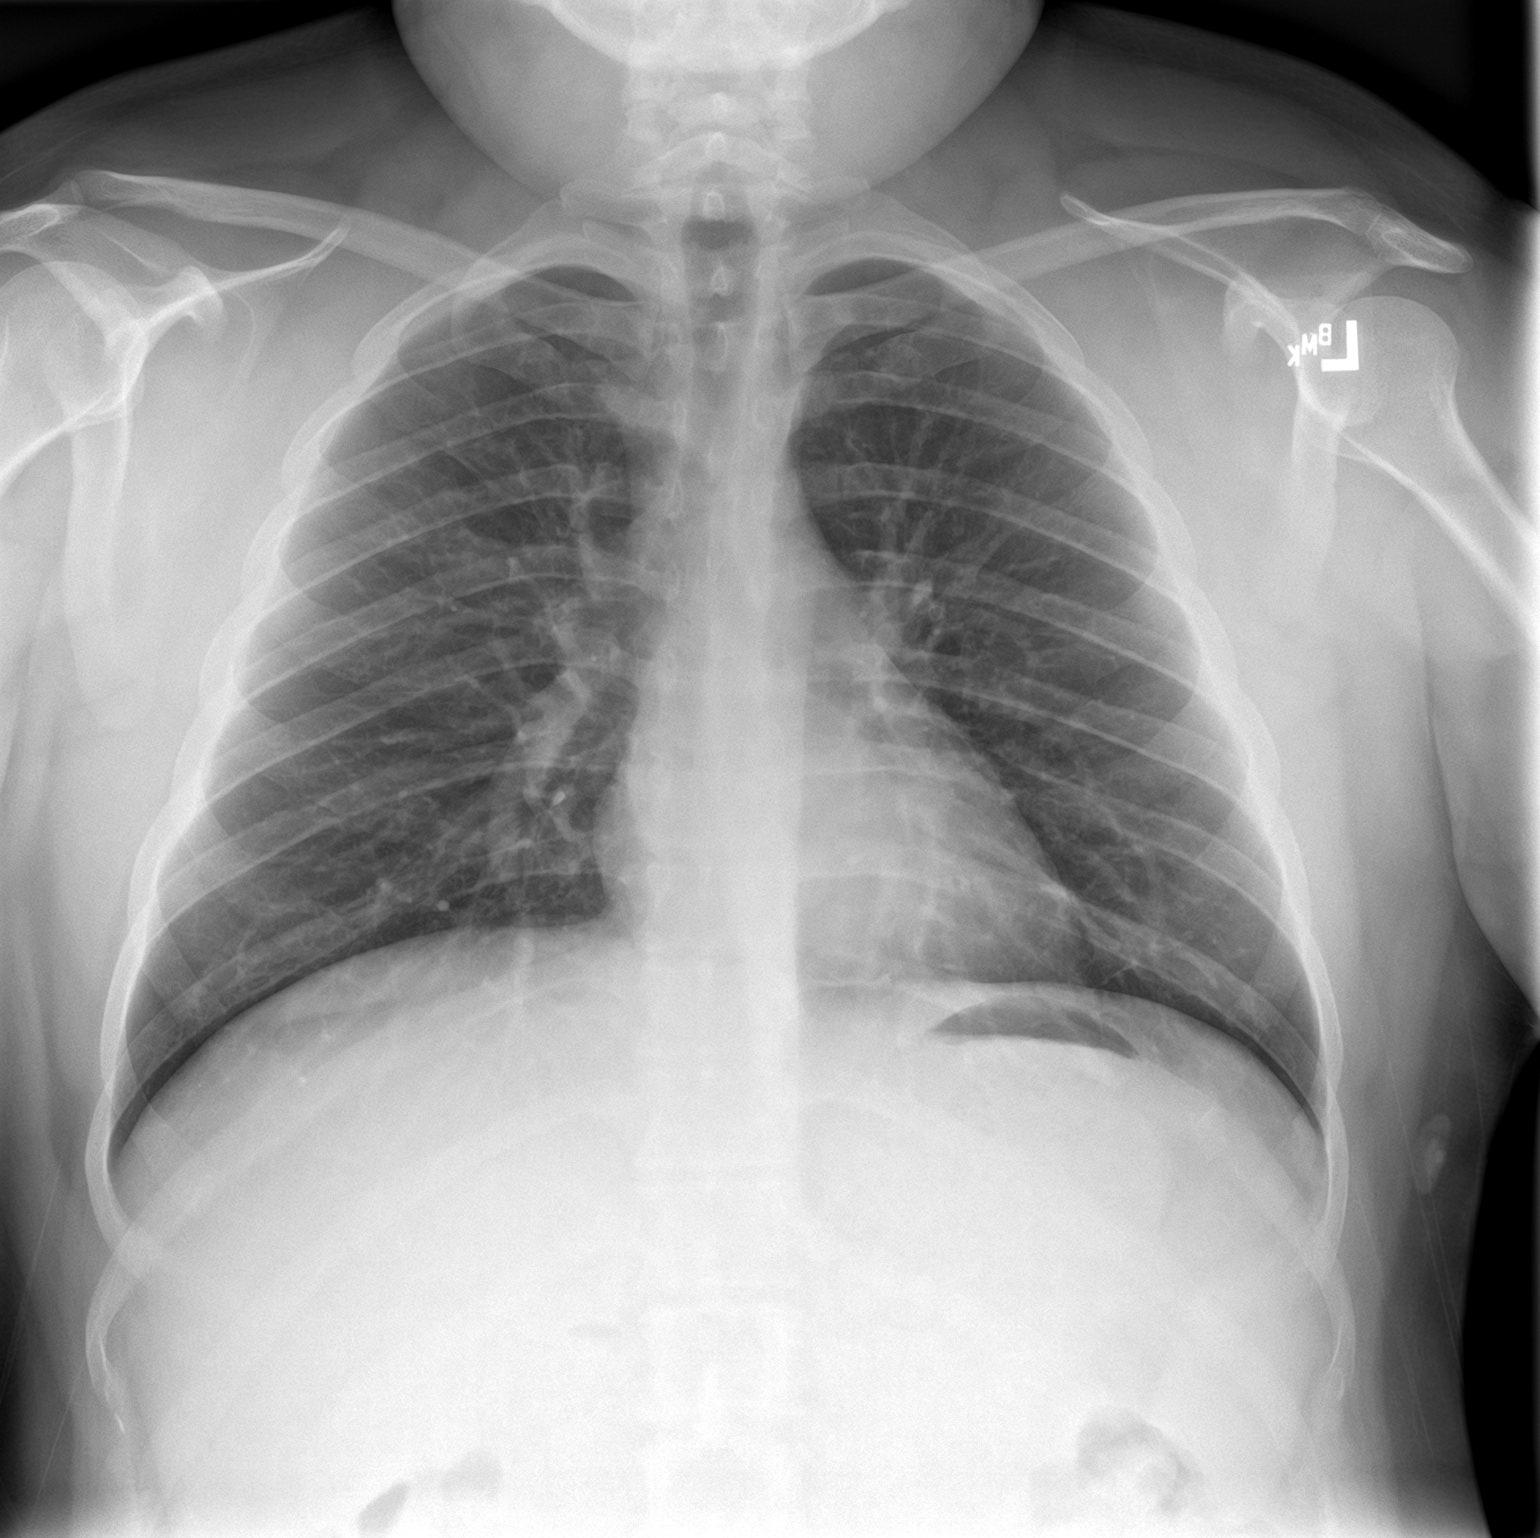

[chest lat]
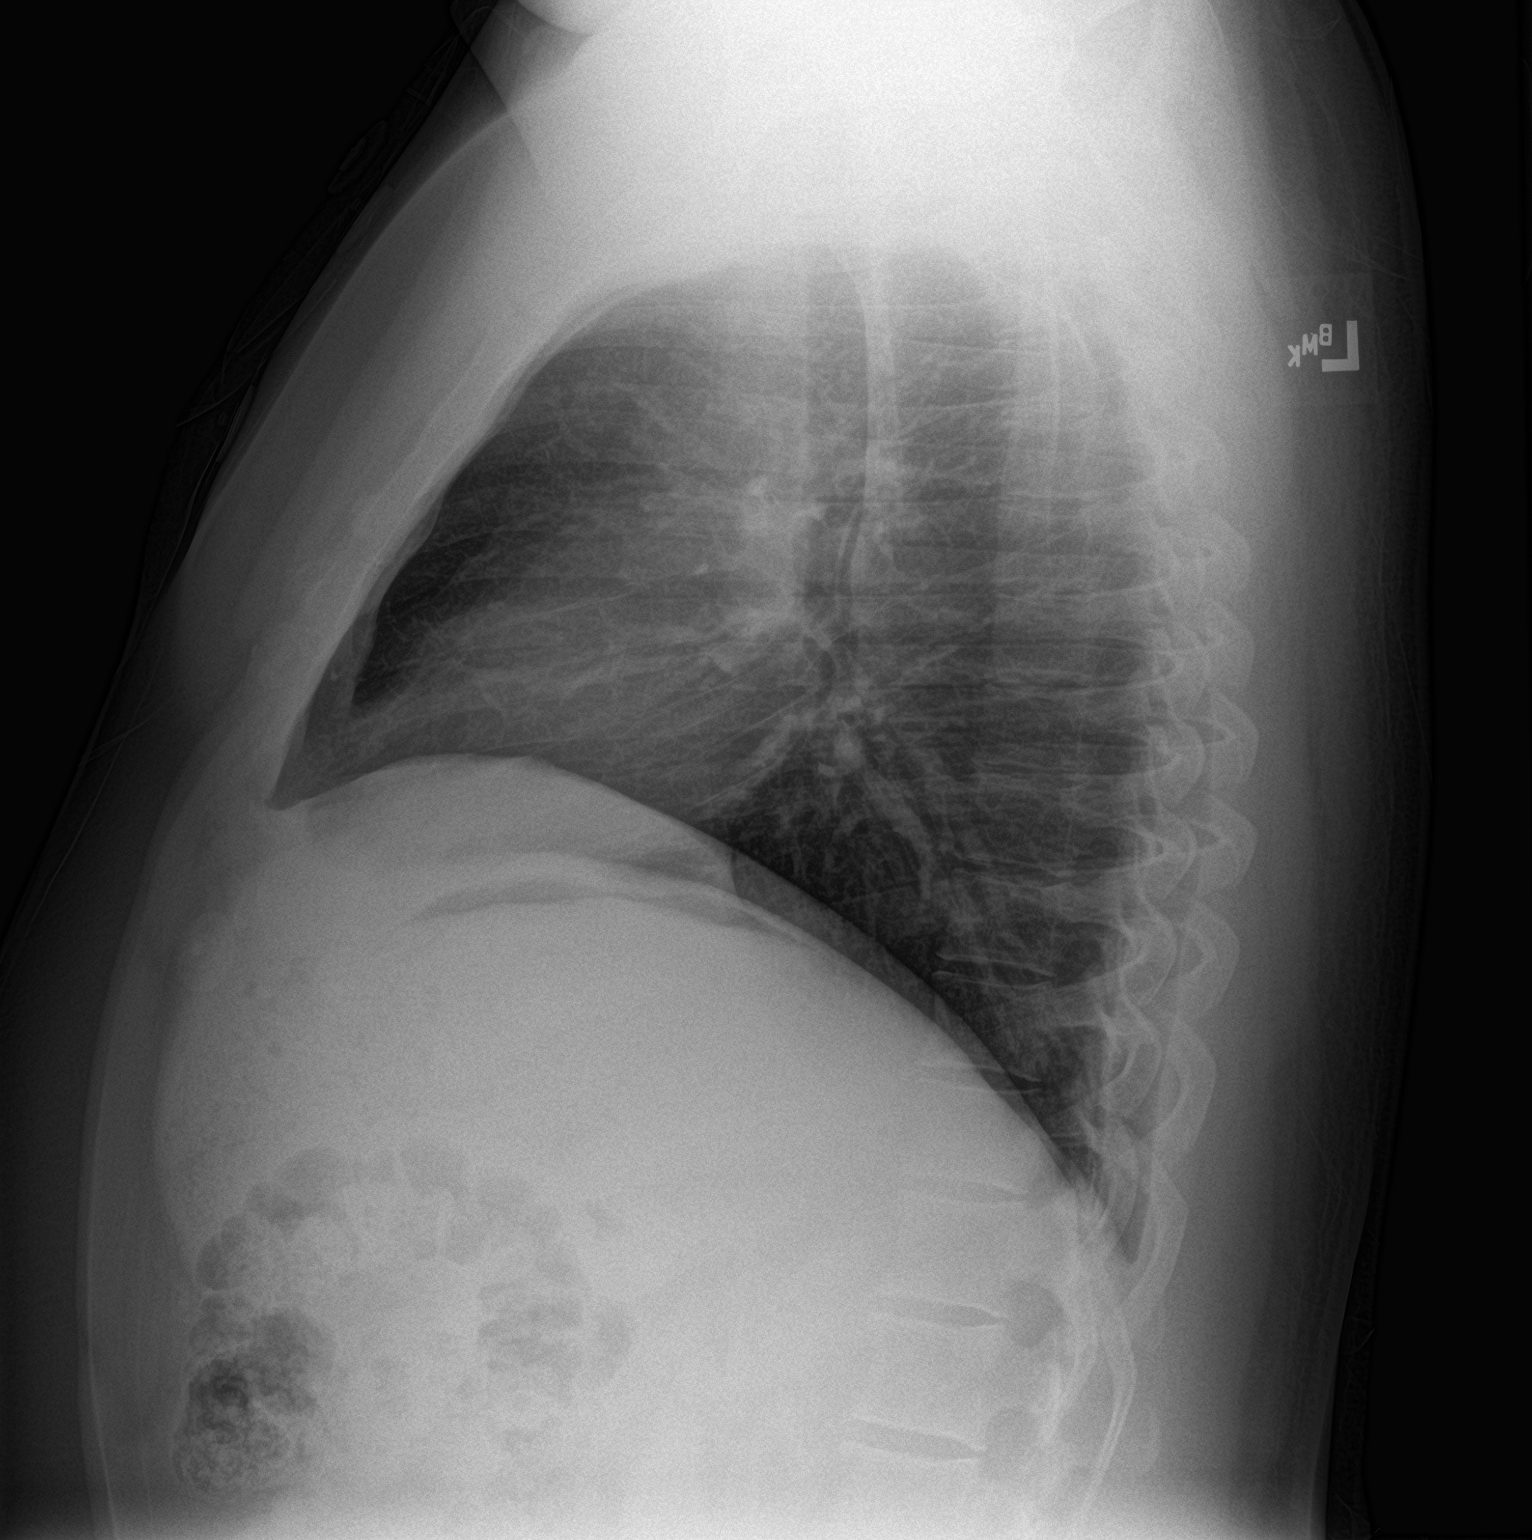

[2 of 2 positions shown; findings below may reference images not displayed]

FINDINGS: The cardiac silhouette, mediastinal and hilar contours are normal.
The lungs are clear. The bony thorax is intact.
IMPRESSION: Normal chest x-ray.

## 2023-01-03 IMAGING — CR DG CHEST 2V
1 series · 2 of 2 positions shown · non-contrast
Comparison: Chest radiograph dated 03/16/2021.

CLINICAL DATA: 25-year-old male with palpitation.

EXAM:
CHEST - 2 VIEW

[Series 1: dg chest 2 view · 0.14mm/px · 2 of 2 slices shown]
[im 1/2]
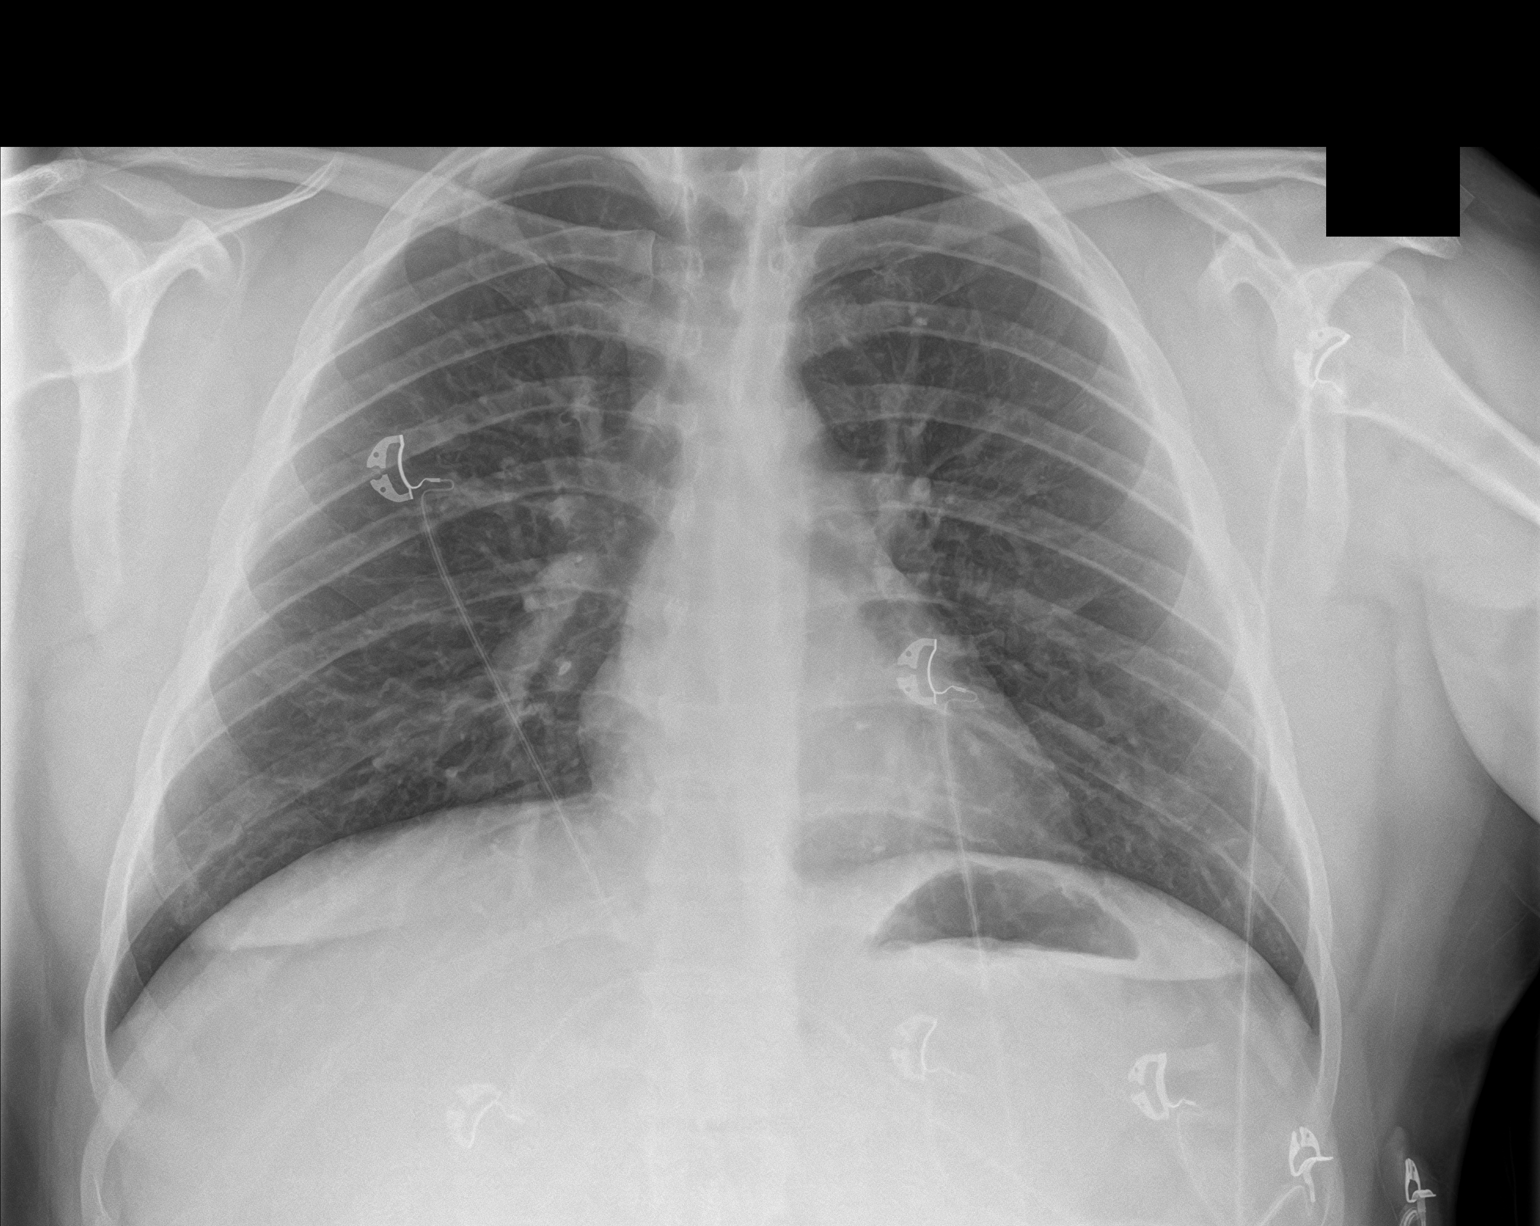
[im 2/2]
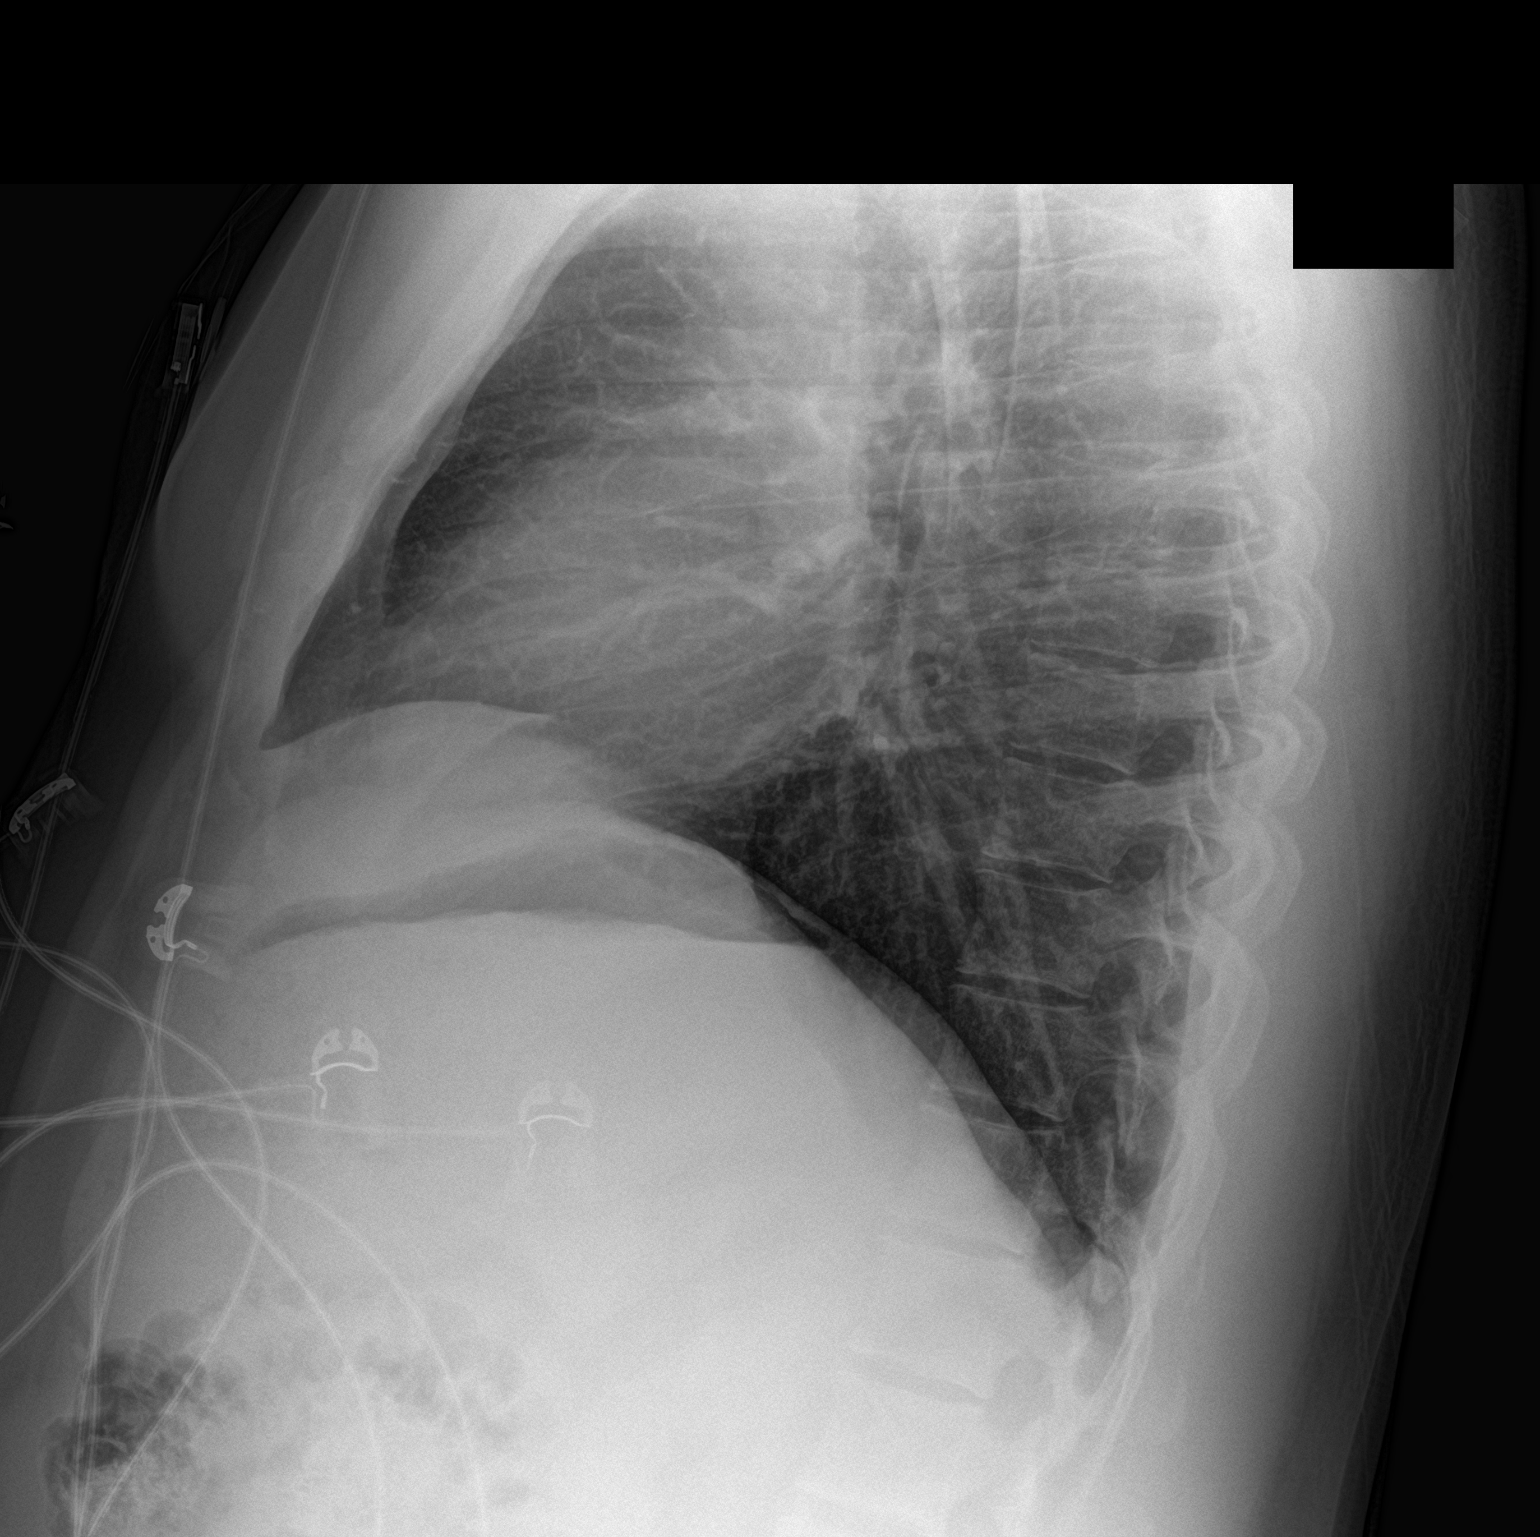

[2 of 2 positions shown; findings below may reference images not displayed]

FINDINGS: The heart size and mediastinal contours are within normal limits.
Both lungs are clear. The visualized skeletal structures are
unremarkable.
IMPRESSION: No active cardiopulmonary disease.
